# Patient Record
Sex: Female | Born: 1945
Health system: Southern US, Community
[De-identification: ages and names within clinical notes are randomized; demographics above are authoritative.]

## PROBLEM LIST (undated history)

## (undated) DIAGNOSIS — C801 Malignant (primary) neoplasm, unspecified: Secondary | ICD-10-CM

## (undated) DIAGNOSIS — N920 Excessive and frequent menstruation with regular cycle: Secondary | ICD-10-CM

## (undated) DIAGNOSIS — Z923 Personal history of irradiation: Secondary | ICD-10-CM

## (undated) DIAGNOSIS — D219 Benign neoplasm of connective and other soft tissue, unspecified: Secondary | ICD-10-CM

## (undated) HISTORY — DX: Malignant (primary) neoplasm, unspecified: C80.1

## (undated) HISTORY — DX: Excessive and frequent menstruation with regular cycle: N92.0

## (undated) HISTORY — PX: BREAST SURGERY: SHX581

## (undated) HISTORY — DX: Benign neoplasm of connective and other soft tissue, unspecified: D21.9

## (undated) HISTORY — PX: GUM SURGERY: SHX658

## (undated) HISTORY — PX: NASAL SINUS SURGERY: SHX719

---

## 1973-01-11 HISTORY — PX: BREAST EXCISIONAL BIOPSY: SUR124

## 1991-01-12 HISTORY — PX: VAGINAL HYSTERECTOMY: SUR661

## 1996-01-12 HISTORY — PX: BREAST EXCISIONAL BIOPSY: SUR124

## 1999-06-03 ENCOUNTER — Other Ambulatory Visit: Admission: RE | Admit: 1999-06-03 | Discharge: 1999-06-03 | Payer: Self-pay | Admitting: Obstetrics and Gynecology

## 1999-12-23 ENCOUNTER — Other Ambulatory Visit: Admission: RE | Admit: 1999-12-23 | Discharge: 1999-12-23 | Payer: Self-pay | Admitting: Radiology

## 1999-12-23 ENCOUNTER — Ambulatory Visit (HOSPITAL_COMMUNITY): Admission: RE | Admit: 1999-12-23 | Discharge: 1999-12-23 | Payer: Self-pay | Admitting: Internal Medicine

## 1999-12-23 ENCOUNTER — Encounter (INDEPENDENT_AMBULATORY_CARE_PROVIDER_SITE_OTHER): Payer: Self-pay | Admitting: Specialist

## 2000-01-12 DIAGNOSIS — Z923 Personal history of irradiation: Secondary | ICD-10-CM

## 2000-01-12 DIAGNOSIS — C801 Malignant (primary) neoplasm, unspecified: Secondary | ICD-10-CM

## 2000-01-12 HISTORY — DX: Malignant (primary) neoplasm, unspecified: C80.1

## 2000-01-12 HISTORY — PX: BREAST LUMPECTOMY: SHX2

## 2000-01-12 HISTORY — DX: Personal history of irradiation: Z92.3

## 2000-01-14 ENCOUNTER — Encounter (HOSPITAL_BASED_OUTPATIENT_CLINIC_OR_DEPARTMENT_OTHER): Payer: Self-pay | Admitting: General Surgery

## 2000-01-14 ENCOUNTER — Encounter (INDEPENDENT_AMBULATORY_CARE_PROVIDER_SITE_OTHER): Payer: Self-pay | Admitting: *Deleted

## 2000-01-14 ENCOUNTER — Ambulatory Visit (HOSPITAL_COMMUNITY): Admission: RE | Admit: 2000-01-14 | Discharge: 2000-01-15 | Payer: Self-pay | Admitting: General Surgery

## 2000-02-02 ENCOUNTER — Encounter: Admission: RE | Admit: 2000-02-02 | Discharge: 2000-05-02 | Payer: Self-pay | Admitting: Radiation Oncology

## 2000-06-07 ENCOUNTER — Other Ambulatory Visit: Admission: RE | Admit: 2000-06-07 | Discharge: 2000-06-07 | Payer: Self-pay | Admitting: Obstetrics and Gynecology

## 2000-06-28 ENCOUNTER — Encounter: Admission: RE | Admit: 2000-06-28 | Discharge: 2000-06-28 | Payer: Self-pay | Admitting: Oncology

## 2000-06-28 ENCOUNTER — Encounter: Payer: Self-pay | Admitting: Oncology

## 2001-07-31 ENCOUNTER — Encounter: Admission: RE | Admit: 2001-07-31 | Discharge: 2001-07-31 | Payer: Self-pay | Admitting: Oncology

## 2001-07-31 ENCOUNTER — Encounter: Payer: Self-pay | Admitting: Oncology

## 2001-10-31 ENCOUNTER — Encounter: Payer: Self-pay | Admitting: Oncology

## 2001-10-31 ENCOUNTER — Encounter: Admission: RE | Admit: 2001-10-31 | Discharge: 2001-10-31 | Payer: Self-pay | Admitting: Oncology

## 2002-01-30 ENCOUNTER — Other Ambulatory Visit: Admission: RE | Admit: 2002-01-30 | Discharge: 2002-01-30 | Payer: Self-pay | Admitting: Obstetrics and Gynecology

## 2002-08-09 ENCOUNTER — Encounter: Payer: Self-pay | Admitting: Oncology

## 2002-08-09 ENCOUNTER — Encounter: Admission: RE | Admit: 2002-08-09 | Discharge: 2002-08-09 | Payer: Self-pay | Admitting: Oncology

## 2002-09-07 ENCOUNTER — Ambulatory Visit (HOSPITAL_COMMUNITY): Admission: RE | Admit: 2002-09-07 | Discharge: 2002-09-07 | Payer: Self-pay | Admitting: Gastroenterology

## 2003-04-17 ENCOUNTER — Other Ambulatory Visit: Admission: RE | Admit: 2003-04-17 | Discharge: 2003-04-17 | Payer: Self-pay | Admitting: Obstetrics and Gynecology

## 2003-08-22 ENCOUNTER — Encounter: Admission: RE | Admit: 2003-08-22 | Discharge: 2003-08-22 | Payer: Self-pay | Admitting: Oncology

## 2003-10-17 ENCOUNTER — Encounter: Admission: RE | Admit: 2003-10-17 | Discharge: 2003-10-17 | Payer: Self-pay | Admitting: Oncology

## 2003-12-26 ENCOUNTER — Ambulatory Visit: Admission: RE | Admit: 2003-12-26 | Discharge: 2003-12-26 | Payer: Self-pay | Admitting: Radiation Oncology

## 2004-01-16 ENCOUNTER — Ambulatory Visit: Admission: RE | Admit: 2004-01-16 | Discharge: 2004-01-16 | Payer: Self-pay | Admitting: Radiation Oncology

## 2004-01-23 ENCOUNTER — Ambulatory Visit: Admission: RE | Admit: 2004-01-23 | Discharge: 2004-01-23 | Payer: Self-pay | Admitting: Radiation Oncology

## 2004-04-30 ENCOUNTER — Other Ambulatory Visit: Admission: RE | Admit: 2004-04-30 | Discharge: 2004-04-30 | Payer: Self-pay | Admitting: Addiction Medicine

## 2004-10-13 ENCOUNTER — Encounter: Admission: RE | Admit: 2004-10-13 | Discharge: 2004-10-13 | Payer: Self-pay | Admitting: Oncology

## 2004-10-19 ENCOUNTER — Ambulatory Visit: Payer: Self-pay | Admitting: Oncology

## 2005-01-11 HISTORY — PX: BREAST BIOPSY: SHX20

## 2005-06-28 ENCOUNTER — Other Ambulatory Visit: Admission: RE | Admit: 2005-06-28 | Discharge: 2005-06-28 | Payer: Self-pay | Admitting: Obstetrics and Gynecology

## 2005-10-15 ENCOUNTER — Ambulatory Visit: Payer: Self-pay | Admitting: Oncology

## 2005-10-19 ENCOUNTER — Encounter: Admission: RE | Admit: 2005-10-19 | Discharge: 2005-10-19 | Payer: Self-pay | Admitting: Oncology

## 2005-11-04 ENCOUNTER — Encounter: Admission: RE | Admit: 2005-11-04 | Discharge: 2005-11-04 | Payer: Self-pay | Admitting: Oncology

## 2006-04-15 ENCOUNTER — Ambulatory Visit: Payer: Self-pay | Admitting: Oncology

## 2006-04-20 LAB — COMPREHENSIVE METABOLIC PANEL
ALT: 14 U/L (ref 0–35)
AST: 13 U/L (ref 0–37)
Albumin: 4.3 g/dL (ref 3.5–5.2)
Calcium: 9.9 mg/dL (ref 8.4–10.5)
Chloride: 102 mEq/L (ref 96–112)
Creatinine, Ser: 0.66 mg/dL (ref 0.40–1.20)
Potassium: 3.9 mEq/L (ref 3.5–5.3)
Sodium: 140 mEq/L (ref 135–145)
Total Protein: 7.2 g/dL (ref 6.0–8.3)

## 2006-04-20 LAB — CBC WITH DIFFERENTIAL/PLATELET
BASO%: 0.9 % (ref 0.0–2.0)
EOS%: 1 % (ref 0.0–7.0)
MCH: 31.3 pg (ref 26.0–34.0)
MCHC: 34.7 g/dL (ref 32.0–36.0)
RDW: 11.5 % (ref 11.3–14.5)
WBC: 7.6 10*3/uL (ref 3.9–10.0)
lymph#: 2.6 10*3/uL (ref 0.9–3.3)

## 2006-06-21 ENCOUNTER — Ambulatory Visit: Payer: Self-pay | Admitting: Oncology

## 2006-06-30 ENCOUNTER — Other Ambulatory Visit: Admission: RE | Admit: 2006-06-30 | Discharge: 2006-06-30 | Payer: Self-pay | Admitting: Obstetrics and Gynecology

## 2006-10-21 ENCOUNTER — Encounter: Admission: RE | Admit: 2006-10-21 | Discharge: 2006-10-21 | Payer: Self-pay | Admitting: Internal Medicine

## 2007-10-02 ENCOUNTER — Encounter: Admission: RE | Admit: 2007-10-02 | Discharge: 2007-10-02 | Payer: Self-pay | Admitting: Otolaryngology

## 2007-10-26 ENCOUNTER — Ambulatory Visit (HOSPITAL_COMMUNITY): Admission: RE | Admit: 2007-10-26 | Discharge: 2007-10-26 | Payer: Self-pay | Admitting: Otolaryngology

## 2007-10-26 ENCOUNTER — Encounter (INDEPENDENT_AMBULATORY_CARE_PROVIDER_SITE_OTHER): Payer: Self-pay | Admitting: Otolaryngology

## 2007-11-07 ENCOUNTER — Encounter: Admission: RE | Admit: 2007-11-07 | Discharge: 2007-11-07 | Payer: Self-pay | Admitting: Oncology

## 2007-12-01 ENCOUNTER — Other Ambulatory Visit: Admission: RE | Admit: 2007-12-01 | Discharge: 2007-12-01 | Payer: Self-pay | Admitting: Obstetrics and Gynecology

## 2007-12-01 ENCOUNTER — Encounter: Payer: Self-pay | Admitting: Obstetrics and Gynecology

## 2007-12-01 ENCOUNTER — Ambulatory Visit: Payer: Self-pay | Admitting: Obstetrics and Gynecology

## 2007-12-06 ENCOUNTER — Emergency Department (HOSPITAL_COMMUNITY): Admission: EM | Admit: 2007-12-06 | Discharge: 2007-12-07 | Payer: Self-pay | Admitting: Emergency Medicine

## 2008-10-28 ENCOUNTER — Encounter: Admission: RE | Admit: 2008-10-28 | Discharge: 2008-10-28 | Payer: Self-pay | Admitting: Otolaryngology

## 2008-11-06 ENCOUNTER — Encounter: Admission: RE | Admit: 2008-11-06 | Discharge: 2008-11-06 | Payer: Self-pay | Admitting: Obstetrics and Gynecology

## 2008-12-18 ENCOUNTER — Ambulatory Visit: Payer: Self-pay | Admitting: Obstetrics and Gynecology

## 2008-12-18 ENCOUNTER — Other Ambulatory Visit: Admission: RE | Admit: 2008-12-18 | Discharge: 2008-12-18 | Payer: Self-pay | Admitting: Obstetrics and Gynecology

## 2009-02-18 ENCOUNTER — Ambulatory Visit: Payer: Self-pay | Admitting: Obstetrics and Gynecology

## 2009-09-17 ENCOUNTER — Other Ambulatory Visit: Admission: RE | Admit: 2009-09-17 | Discharge: 2009-09-17 | Payer: Self-pay | Admitting: Obstetrics and Gynecology

## 2009-09-17 ENCOUNTER — Ambulatory Visit: Payer: Self-pay | Admitting: Obstetrics and Gynecology

## 2009-11-21 ENCOUNTER — Encounter: Admission: RE | Admit: 2009-11-21 | Discharge: 2009-11-21 | Payer: Self-pay | Admitting: Oncology

## 2010-05-26 NOTE — H&P (Signed)
Lindsay Alvarado, GRUMBINE               ACCOUNT NO.:  [id number]   MEDICAL RECORD NO.:  [id number]          PATIENT TYPE:  AMB   LOCATION:  SDS                          FACILITY:  [facility]   PHYSICIAN:  Hermelinda Medicus, M.D.   DATE OF BIRTH:  1946-01-04   DATE OF ADMISSION:  10/26/2007  DATE OF DISCHARGE:                              HISTORY & PHYSICAL   HISTORY OF PRESENT ILLNESS:  This patient is a 65 year old female who  has had difficulty with upper dental issues and had an implant placed.  The implant placement was uneventful, but she had some follow up with  the sinusitis issues, which were difficult to resolve.  She was cultured  in the office and had been on clindamycin and has been recently on  Avelox.  Her cultures were negative and there was no evidence of any  yeast or fungal components, however, her sinus x-ray showed considerable  sinusitis and after treatment, she had a second CAT scan, which showed  right anterior ethmoid and maxillary sinuses completely opacified.  A  frontal was mentioned with the frontal sinuses incredibly small very  minimal in size.  So essentially her problem is an ethmoid and maxillary  sinus, which is totally obliterated with mucus.  On examination, she was  still showing some purulent materials and some whitish material.  She  has responded clinically very well to the Avelox, but now enters for  functional endoscopic sinus surgery.  We will get this maxillary sinus  and ethmoid sinus adequately drained doing a right ethmoidectomy and a  right maxillary sinus ostial enlargement with antrostomy.   PAST MEDICAL HISTORY:  Remarkable and the fact that she has got a  MORPHINE ALLERGY, ADHESIVE ALLERGY, VERSED, CODEINE, PENICILLIN, AND  SULFA also give her allergic response.   She takes indomethacin.  Her medication list is considerable and it is  in the chart.  She has also been on Cleocin back in June and Levaquin in  June, which the doctors felt would  resolve this problem.  She has also  been on Augmentin in the past.   PHYSICAL EXAMINATION:  VITAL SIGNS:  Well-nourished and well-developed  female with blood pressures of 141/80, her pulse is 76, and SaO2 is 95.  HEENT:  Ears are clear.  Tympanic membranes clear.  Her left nose is  perfectly normal and her right nose as you look in you see considerable  erythema and edema of the middle turbinate and some evidence of white  purulent material in that region.  There is no evidence of any posterior  nasal pharyngeal abscess or purulent materials on the back of her  throat, though the patient does complain about a bad taste.  No evidence  of any eye symptoms.  Extraocular motions are completely clear and no  evidence of any erythema or orbital cellulitis.  The dental implant  appears to be free of any symptoms.  No pain in that region.  Her larynx  is clear.  True cords, false cords, epiglottis, base of tongue were all  free of any infection or mass, but I  can see some purulent drainage in  the right piriform.  The true cord mobility, gag reflex, tongue  mobility, EOMs, and facial nerve, and shoulder strength are all  symmetrical.  CHEST:  Clear.  No rales, rhonchi, or wheezes.  CARDIOVASCULAR:  No opening snaps, murmurs, or gallops.  EXTREMITIES:  Unremarkable.   INITIAL DIAGNOSIS:  Right maxillary and ethmoid sinusitis associated  with a upper quadrant implant with a history of persistent sinusitis  refractory to the Avelox, Levaquin, Augmentin, and clindamycin.           ______________________________  Hermelinda Medicus, M.D.     JC/MEDQ  D:  10/26/2007  T:  10/26/2007  Job:  [id number]   cc:   Allayne Gitelman. Tanner, D.D.S.  Thora Lance, M.D.

## 2010-05-26 NOTE — Op Note (Signed)
NAMELISA, Lindsay Alvarado               ACCOUNT NO.:  [id number]   MEDICAL RECORD NO.:  [id number]          PATIENT TYPE:  AMB   LOCATION:  SDS                          FACILITY:  [facility]   PHYSICIAN:  Hermelinda Medicus, M.D.   DATE OF BIRTH:  11-11-1945   DATE OF PROCEDURE:  10/26/2007  DATE OF DISCHARGE:                               OPERATIVE REPORT   PREOPERATIVE DIAGNOSES:  Right maxillary and ethmoid sinusitis,  resistant to multiple antibiotics associated right upper dental implant.   POSTOPERATIVE DIAGNOSES:  Right maxillary and ethmoid sinusitis,  resistant to multiple antibiotics associated right upper dental implant.   OPERATION:  Functional endoscopic sinus surgery, right ethmoidectomy,  and right maxillary sinus ostium enlargement with antrostomy.   OPERATOR:  Hermelinda Medicus, MD   ANESTHESIA:  Local MAC.   PROCEDURE:  The patient was placed in a supine position under local MAC  anesthesia, the 1% Xylocaine with epinephrine 7 mL and topical cocaine  200 mg.  The patient was prepped and draped in a proper manner and then  the inferior turbinate was outfractured in such a way as to gain as much  space as possible in the right side of her nose.  The middle turbinate  which was also very swollen was infractured to the point of gaining and  placing at most medial port of the septum as possible to gain some space  for ethmoid and maxillary sinus drainage.  Once this was achieved, then  some anterior ethmoid material and some purulent material was removed  and this was placed for aerobic, anaerobic, and fungal culture for a  review of culture of this patient and then antibiotics were given.  Her  previous antibiotics had been Avelox.  The IV antibiotics given now are  going to be clindamycin 600 mg.  Once this was completed, then the  ethmoid using the 0-degree scope was entered completely and this was  cleared off the thickened membrane, which was at least 5 times thicker  than its  normal thickness blocking the tissues, also the area was  suctioned of the mucoid material, which was quite in nature had no odor.  Once the ethmoid was completed and completely opened, then the maxillary  sinus was approached.  Again, we approached it through very thickened  membrane.  The natural ostium was made about 5 times its normal size and  then the thickened membrane was also removed for pathology and further  mucous mucopurulent material was suctioned.  The mucopurulent material  was also cultured.  Once again aerobic and anaerobic and further  material was placed for culture as well as for pathology.  Antrostomy  was completed as we had suctioned so much material, we wanted a good  clearance of this whitish purulent matter and this was cleared very  well.  We could see into the sinus, the sinus showed very thickened  membrane, but now that it would drain adequately, we felt this would  thin down and appropriately drain.  Once this was completed, all the  sinus was suctioned and viewed.  The mucous membrane appeared to  be in  good condition throughout.  Using the scope and the side-biting forceps,  the angled Blakesley Wilder's were used to increase this size of the  natural ostium.  Once this was completed, then some Gelfoam was placed  within the ethmoid sinus and a Gelfilm was used to hold the middle  turbinate medial and then Telfa was placed within the nose.  The patient  tolerated the procedure well, is doing well postoperatively, would be  followed as an outpatient, and tomorrow will be seen in the office for  removal of the Telfa dressing, and then will be followed in 5 days or 10  days and 3 weeks or 6 weeks, 3 months or 6 months, and a year and the  patient is well aware of the risks and gains as read the booklet we  gave her.  Her vision is excellent postoperatively.  She has no  difficulties in this category.  She is aware that she is to be home and  taking it easy  and not blowing her nose hard and not doing any heavy  lifting.  The blood loss here was approximately 20 mL.           ______________________________  Hermelinda Medicus, M.D.     JC/MEDQ  D:  10/26/2007  T:  10/26/2007  Job:  [id number]   cc:   Thora Lance, M.D.  Allayne Gitelman. Tanner, D.D.S.

## 2010-05-29 NOTE — Op Note (Signed)
[place]  Patient:    Lindsay Alvarado, Lindsay Alvarado                      MRN: [id number] Proc. Date: 01/14/00 Adm. Date:  [id number] Disc. Date: [id number] Attending:  Lillia Mountain CC:         Mardene Celeste. Lurene Shadow, M.D. (2 copies)   Operative Report  PREOPERATIVE DIAGNOSIS:  Carcinoma of the right breast.  POSTOPERATIVE DIAGNOSIS:  Carcinoma of the right breast.  PROCEDURE:  Lumpectomy and sentinel lymph node biopsy.  SURGEON:  Mardene Celeste. Lurene Shadow, M.D.  ASSISTANT:  Nurse.  ANESTHESIA:  General.  HISTORY OF PRESENT ILLNESS:  [name] is a 65 year old woman presenting with an abnormal mammogram with pleomorphic calcifications.  This was biopsied by a core biopsy.  This showed an intraductal carcinoma.  She is scheduled now and brought to the operating room for wide-excision lumpectomy and sentinel lymph node biopsy.  DESCRIPTION OF PROCEDURE:  Following the induction of anesthesia with the patient positioned supinely, the right breast was infiltrated with ______ dye in the ______ region.  She had previously undergone needle localization of the region of biopsy, as well as radionuclide injection.  The breast was then prepped and draped to be included in the sterile operative field.  I made an elliptical incision encircling the localizing needle, deeming this through the skin and subcutaneous tissues, taking a generous wedge of breast tissue around the region of the localizing needle, all the way down to the chest wall.  This was removed and forwarded for specimen mammography.  Specimen mammography showed that the placement clip was well within the depths of the specimen. The specimen was then sent for touch-prep.  Touch-prep showed negative margins.  I then turned attention to the right axilla, mapping the region of the right axilla to the area of highest radioactive emissions.  I made an axillary incision, deemed this through the skin and  subcutaneous tissues, dissecting down to the region where I encountered three separate hot-blue lymph nodes, which were all dissected free and forwarded for pathologic evaluation.  Touch-preps on these lymph nodes showed no evidence of carcinoma. Final pathology is pending.  All areas of dissection were then checked thoroughly for hemostasis.  Sponge, instruments, and sharp counts were then clarified.  The wound was closed in layers as follows:  The breast wound was closed with interrupted 3-0 Vicryl sutures and the subcutaneous tissue and the skin was closed with Dermabond.  Similarly, subcutaneous tissues of the axillary wound were closed with interrupted sutures of 3-0 Vicryl and the skin closed with Dermabond.  Sterile dressing was applied, anesthetic reversed. The patient was moved from the operating room to the recovery room in stable condition, having tolerated the procedure well. DD:  01/14/00 TD:  01/14/00 Job: [id number]

## 2010-05-29 NOTE — Op Note (Signed)
   NAME:  Lindsay Alvarado, Lindsay Alvarado                         ACCOUNT NO.:  [id number]   MEDICAL RECORD NO.:  [id number]                   PATIENT TYPE:  AMB   LOCATION:  ENDO                                 FACILITY:  [facility]   PHYSICIAN:  Danise Edge, M.D.                DATE OF BIRTH:  1945/04/04   DATE OF PROCEDURE:  09/07/2002  DATE OF DISCHARGE:                                 OPERATIVE REPORT   PROCEDURE:  Screening colonoscopy.   INDICATIONS FOR PROCEDURE:  Ms. Kathi Dohn is a 65 year old female born  06/30/1945.  Ms. Halberg is scheduled to undergo her first screening  colonoscopy with polypectomy to prevent colon cancer.   ENDOSCOPIST:  Charolett Bumpers, M.D.   PREMEDICATION:  Versed 12.5 mg, Demerol 50 mg.   PROCEDURE:  After obtaining confirmed consent, Ms. Margerum was placed in  the left lateral decubitus position.  I administered intravenous Demerol and  intravenous Versed to achieve conscious sedation for the procedure.  The  patient's blood pressure, oxygen saturation, and cardiac rhythm were  monitored throughout the procedure and documented in the medical record.  Anal inspection was normal.  Digital rectal exam was normal.  The Olympus  adjustable pediatric colonoscope was introduced into the rectum and advanced  to the cecum.  Colonic preparation for the exam today was excellent.   Rectum normal.  Sigmoid colon and descending colon normal.  Splenic flexure normal.  Transverse colon normal.  Hepatic flexure normal.  Ascending colon normal.  Cecum and ileocecal valve normal.   ASSESSMENT:  Normal screening proctocolonoscopy to the cecum, no endoscopic  evidence for the presence of colorectal neoplasia.                                               Danise Edge, M.D.    MJ/MEDQ  D:  09/07/2002  T:  09/07/2002  Job:  [id number]   cc:   Thora Lance, M.D.  [address] [place]  [place] [address]  Fax: [phone]   Tasia Catchings, M.D.  [address]  [place]  [place] [address]  Fax: [phone]   Valentino Hue. Magrinat, M.D.  [address] [facility]  [place]  [place] [address]  Fax: [phone]

## 2010-10-12 LAB — CBC
HCT: 39.8
Hemoglobin: 13.4
MCHC: 33.6
MCV: 92.2
Platelets: 280
RBC: 4.31
RDW: 12.9
WBC: 7.8

## 2010-10-12 LAB — FUNGUS CULTURE W SMEAR: Fungal Smear: NONE SEEN

## 2010-10-12 LAB — URINALYSIS, ROUTINE W REFLEX MICROSCOPIC
Bilirubin Urine: NEGATIVE
Glucose, UA: NEGATIVE
Hgb urine dipstick: NEGATIVE
Ketones, ur: NEGATIVE
Nitrite: NEGATIVE
Protein, ur: NEGATIVE
Specific Gravity, Urine: 1.021
Urobilinogen, UA: 0.2
pH: 6

## 2010-10-12 LAB — ANAEROBIC CULTURE

## 2010-10-12 LAB — CULTURE, ROUTINE-SINUS: Culture: NO GROWTH

## 2010-10-23 ENCOUNTER — Other Ambulatory Visit: Payer: Self-pay | Admitting: Obstetrics and Gynecology

## 2010-10-23 DIAGNOSIS — Z1231 Encounter for screening mammogram for malignant neoplasm of breast: Secondary | ICD-10-CM

## 2010-11-19 ENCOUNTER — Encounter: Payer: Self-pay | Admitting: Obstetrics and Gynecology

## 2010-11-23 ENCOUNTER — Ambulatory Visit
Admission: RE | Admit: 2010-11-23 | Discharge: 2010-11-23 | Disposition: A | Discharge status: Home / Self Care | Payer: BC Managed Care – PPO | Source: Ambulatory Visit | Attending: Obstetrics and Gynecology | Admitting: Obstetrics and Gynecology

## 2010-11-23 DIAGNOSIS — Z1231 Encounter for screening mammogram for malignant neoplasm of breast: Secondary | ICD-10-CM

## 2010-11-30 ENCOUNTER — Encounter: Payer: Self-pay | Admitting: Gynecology

## 2010-11-30 DIAGNOSIS — C801 Malignant (primary) neoplasm, unspecified: Secondary | ICD-10-CM | POA: Insufficient documentation

## 2010-11-30 DIAGNOSIS — N920 Excessive and frequent menstruation with regular cycle: Secondary | ICD-10-CM | POA: Insufficient documentation

## 2010-11-30 DIAGNOSIS — D219 Benign neoplasm of connective and other soft tissue, unspecified: Secondary | ICD-10-CM | POA: Insufficient documentation

## 2010-12-01 ENCOUNTER — Other Ambulatory Visit (HOSPITAL_COMMUNITY)
Admission: RE | Admit: 2010-12-01 | Discharge: 2010-12-01 | Disposition: A | Discharge status: Home / Self Care | Payer: BC Managed Care – PPO | Source: Ambulatory Visit | Attending: Obstetrics and Gynecology | Admitting: Obstetrics and Gynecology

## 2010-12-01 ENCOUNTER — Encounter: Payer: Self-pay | Admitting: Obstetrics and Gynecology

## 2010-12-01 ENCOUNTER — Ambulatory Visit (INDEPENDENT_AMBULATORY_CARE_PROVIDER_SITE_OTHER): Payer: BC Managed Care – PPO | Admitting: Obstetrics and Gynecology

## 2010-12-01 VITALS — BP 130/80 | Ht 64.5 in | Wt 231.0 lb

## 2010-12-01 DIAGNOSIS — Z01419 Encounter for gynecological examination (general) (routine) without abnormal findings: Secondary | ICD-10-CM

## 2010-12-01 DIAGNOSIS — E559 Vitamin D deficiency, unspecified: Secondary | ICD-10-CM

## 2010-12-01 DIAGNOSIS — N644 Mastodynia: Secondary | ICD-10-CM

## 2010-12-01 DIAGNOSIS — E7800 Pure hypercholesterolemia, unspecified: Secondary | ICD-10-CM

## 2010-12-01 NOTE — Progress Notes (Signed)
The patient came to see me today for her annual GYN exam. She is up-to-date both mammograms and bone densities. She is doing well without HRT which is not an option for her because of her breast cancer. She probing her lab work which showed an elevated triglyceride, elevated LDL, and borderline low vitamin D. She is having no pelvic pain. She is having no vaginal bleeding.  HEENT: Within normal limits. Neck: No masses.  Kennon Portela present. Supraclavicular lymph nodes: Not enlarged. Breasts: Examined in both sitting and lying position. Symmetrical without skin changes or masses. Abdomen: Soft no masses guarding or rebound. No hernias. Pelvic: External within normal limits. BUS within normal limits. Vaginal examination shows good estrogen effect, no cystocele enterocele or rectocele. Cervix and uterus absent. Adnexa within normal limits. Rectovaginal confirmatory. Extremities within normal limits.  Assessment: #1. Breast cancer #2. Elevated lipids #3. Borderline vitamin D level  Plan: Continue yearly mammograms. Colonoscopy at [facility]. Increase vitamin D from 2000 to 3000 IU daily. Low cholesterol diet and increase exercise. Recheck lipid profile and vitamin D level in 6 months.

## 2011-01-11 ENCOUNTER — Encounter: Payer: BC Managed Care – PPO | Admitting: Obstetrics and Gynecology

## 2011-10-22 ENCOUNTER — Other Ambulatory Visit: Payer: Self-pay | Admitting: Obstetrics and Gynecology

## 2011-10-25 ENCOUNTER — Telehealth: Payer: Self-pay | Admitting: *Deleted

## 2011-10-25 NOTE — Telephone Encounter (Signed)
Pt called c/o left breast tenderness over weekend, called pt back today and no longer having breast tenderness. Pt said she will watch for now and call back to make OV if tenderness occurs again.

## 2011-12-03 ENCOUNTER — Ambulatory Visit (INDEPENDENT_AMBULATORY_CARE_PROVIDER_SITE_OTHER): Payer: BC Managed Care – PPO | Admitting: Obstetrics and Gynecology

## 2011-12-03 ENCOUNTER — Encounter: Payer: Self-pay | Admitting: Obstetrics and Gynecology

## 2011-12-03 VITALS — BP 128/78 | Ht 65.0 in | Wt 236.0 lb

## 2011-12-03 DIAGNOSIS — Z01419 Encounter for gynecological examination (general) (routine) without abnormal findings: Secondary | ICD-10-CM

## 2011-12-03 NOTE — Progress Notes (Signed)
Patient came to see me today for her annual GYN exam. Over the past month she has had persistent breast tenderness in her left breast under the  nipple. It is more diffuse than  focal. She has had ductal carcinoma in situ of the right breast. She was treated with lumpectomy and radiation. She has not required at adjunctive therapy. She had a vaginal hysterectomy in 1993 for fibroids and menorrhagia. She is having no vaginal bleeding. She is having no pelvic pain. She is doing well in terms of menopausal symptoms. She had normal bone density in 2011. She has never had  abnormal Pap smears. Her last Pap smear was 2012.She does her lab through PCP.  HEENT: Within normal limits. Neck: No masses. Supraclavicular lymph nodes: Not enlarged. Breasts: Examined in both sitting and lying position. Right breast has divot above the nipple from lumpectomy. No masses. Left breast has no skin changes and no masses. Abdomen: Soft no masses guarding or rebound. No hernias. Pelvic: External within normal limits. BUS within normal limits. Vaginal examination shows good estrogen effect, no cystocele enterocele or rectocele. Cervix and uterus absent. Adnexa within normal limits. Rectovaginal confirmatory. Extremities within normal limits.  Assessment: Ductalcarcinoma in situ of right breast. Mastodynia of left breast.  Assessment: Mammogram. Pap not done.The new Pap smear guidelines were discussed with the patient.

## 2011-12-03 NOTE — Patient Instructions (Signed)
We will call you with mammogram appointment.

## 2011-12-04 LAB — URINALYSIS W MICROSCOPIC + REFLEX CULTURE
Bacteria, UA: NONE SEEN
Bilirubin Urine: NEGATIVE
Casts: NONE SEEN
Crystals: NONE SEEN
Glucose, UA: NEGATIVE mg/dL
Hgb urine dipstick: NEGATIVE
Ketones, ur: NEGATIVE mg/dL
Specific Gravity, Urine: 1.019 (ref 1.005–1.030)
pH: 6.5 (ref 5.0–8.0)

## 2011-12-06 ENCOUNTER — Encounter: Payer: Self-pay | Admitting: Obstetrics and Gynecology

## 2011-12-06 ENCOUNTER — Telehealth: Payer: Self-pay | Admitting: *Deleted

## 2011-12-06 DIAGNOSIS — N644 Mastodynia: Secondary | ICD-10-CM

## 2011-12-06 NOTE — Telephone Encounter (Signed)
Order placed in epic

## 2011-12-06 NOTE — Telephone Encounter (Signed)
Message copied by Aura Camps on Mon Dec 06, 2011  8:50 AM ------      Message from: Lindsay Alvarado      Created: Fri Dec 03, 2011  3:07 PM       Patient is having tenderness in left breast under nipple. DCIS of right breast. Schedule diagnostic mammography at [facility] after dec 13 early in am or late in day

## 2011-12-08 NOTE — Telephone Encounter (Signed)
APPT. 12/23/11 @ 8:30 AM

## 2011-12-15 ENCOUNTER — Encounter: Payer: Self-pay | Admitting: Obstetrics and Gynecology

## 2012-01-03 ENCOUNTER — Ambulatory Visit
Admission: RE | Admit: 2012-01-03 | Discharge: 2012-01-03 | Disposition: A | Discharge status: Home / Self Care | Payer: BC Managed Care – PPO | Source: Ambulatory Visit | Attending: Obstetrics and Gynecology | Admitting: Obstetrics and Gynecology

## 2012-01-03 ENCOUNTER — Other Ambulatory Visit: Payer: Self-pay | Admitting: Obstetrics and Gynecology

## 2012-01-03 DIAGNOSIS — N644 Mastodynia: Secondary | ICD-10-CM

## 2012-11-30 ENCOUNTER — Other Ambulatory Visit: Payer: Self-pay

## 2012-11-30 DIAGNOSIS — Z1231 Encounter for screening mammogram for malignant neoplasm of breast: Secondary | ICD-10-CM

## 2013-01-08 IMAGING — MG MM DIGITAL SCREENING BILAT W/ CAD
4 series · 4 of 4 positions shown · non-contrast
Comparison: none

DG SCREEN MAMMOGRAM BILATERAL
Bilateral CC and MLO view(s) were taken.
Technologist: Paulus N Ceejay

RIGHT DIGITAL SCREENING MAMMOGRAM WITH CAD:
The breast tissue is heterogeneously dense.  Right postsurgical change.  No masses or malignant 
type calcifications are identified.  Compared with prior studies.
Images were processed with CAD.

[R CC]
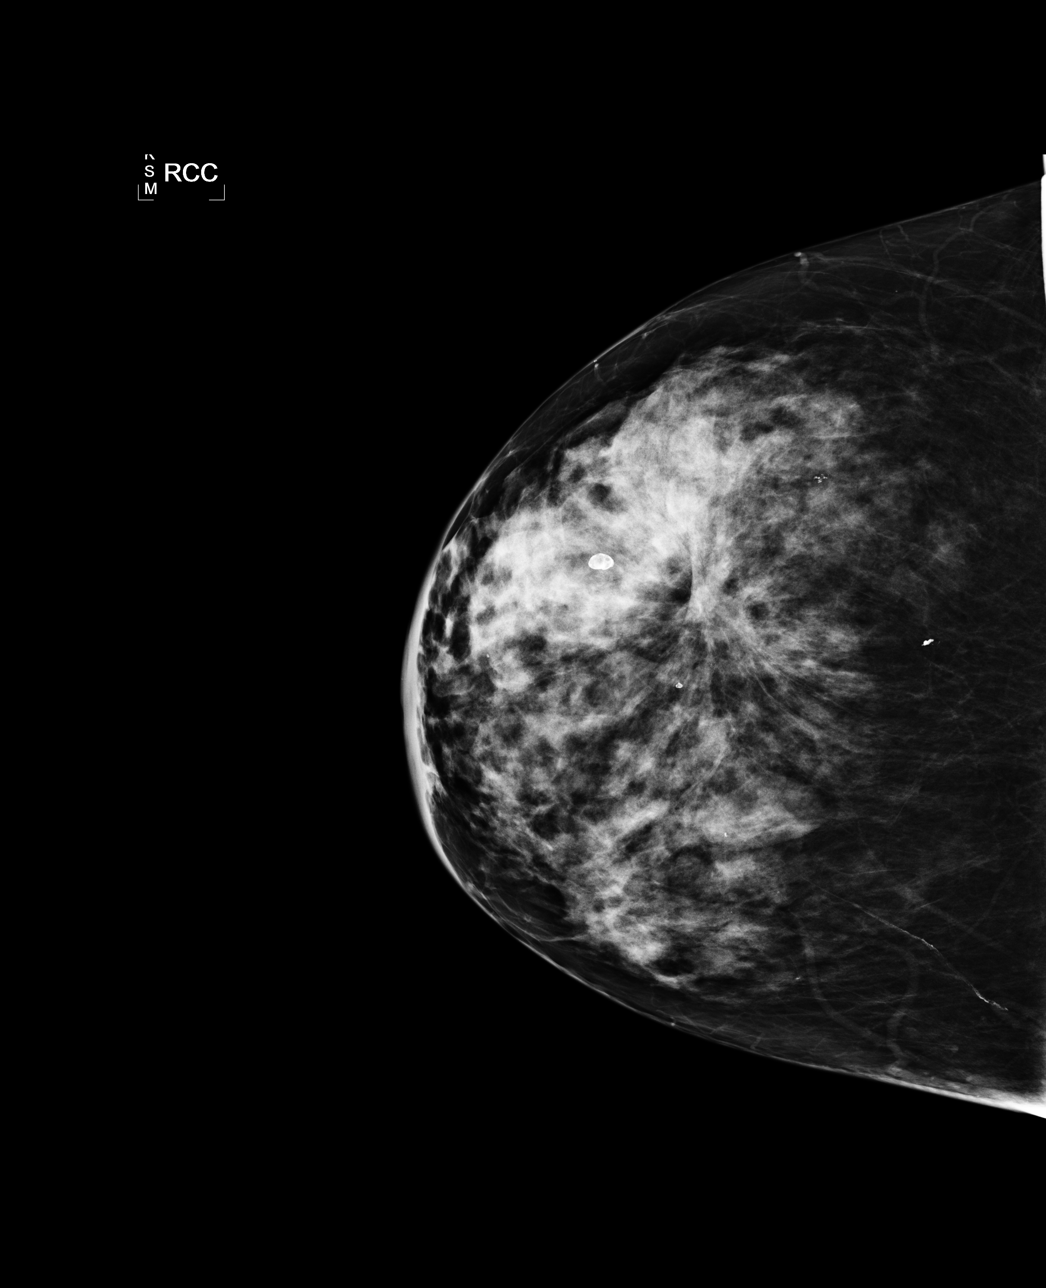

[L CC]
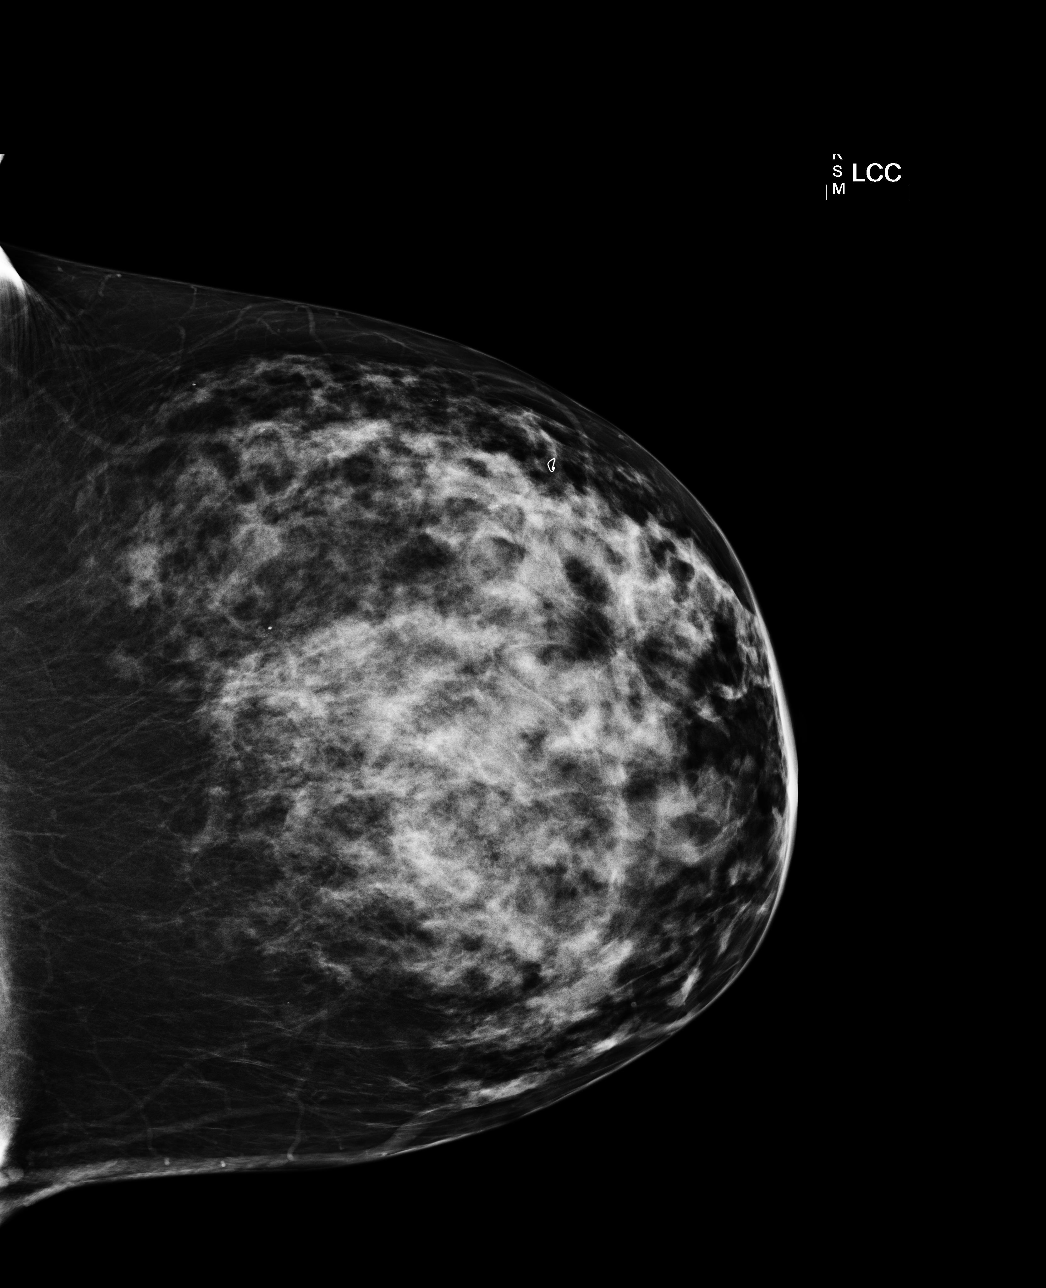

[L MLO]
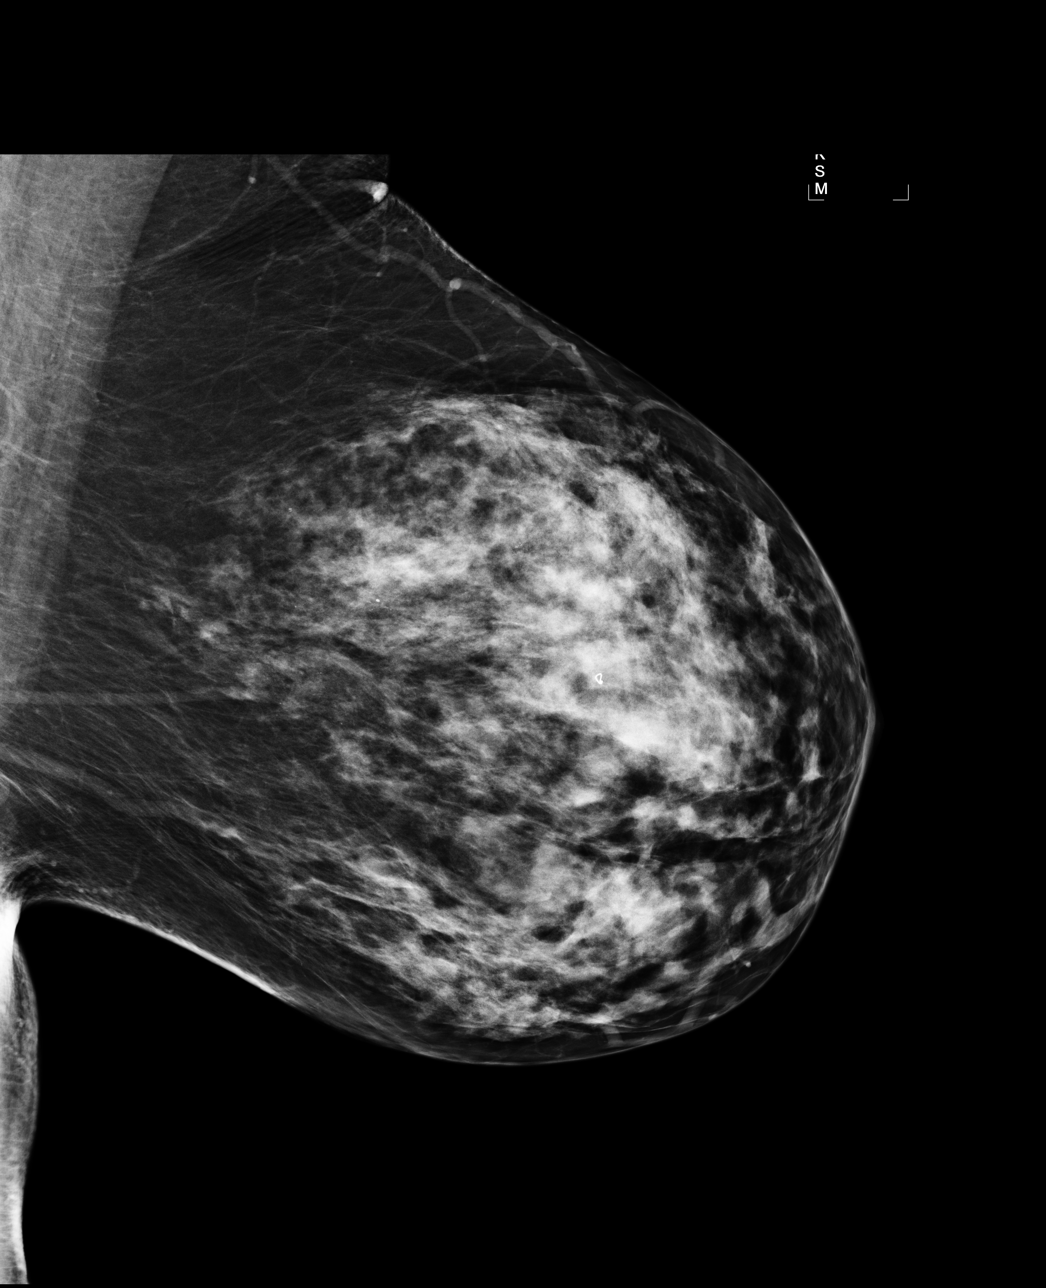

[R MLO]
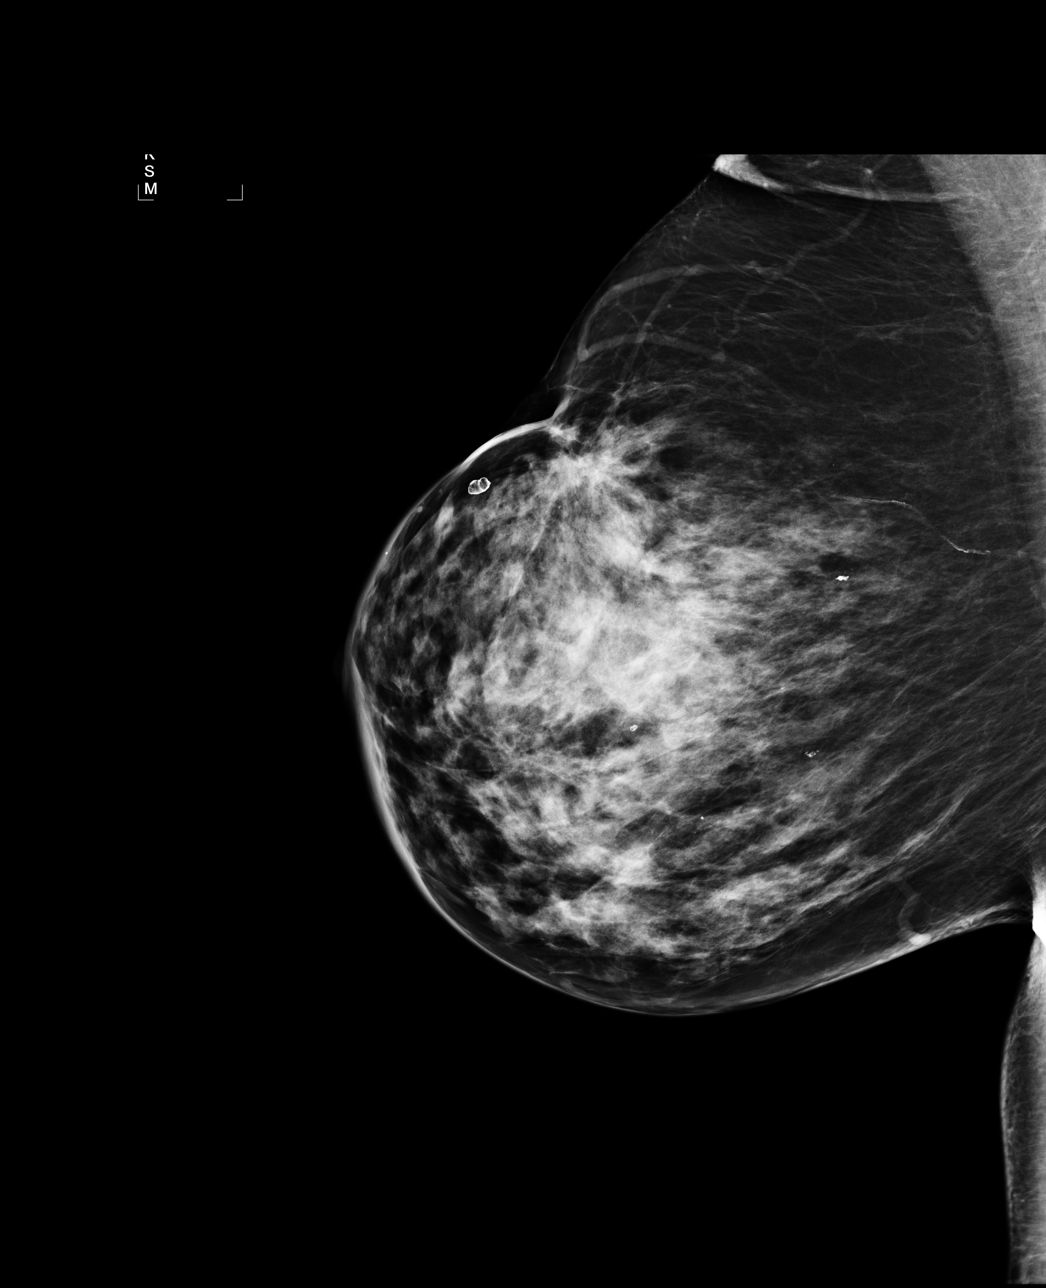

[4 of 4 positions shown; findings below may reference images not displayed]

IMPRESSION: No specific mammographic evidence of malignancy.  Next screening mammogram is recommended in one 
year.

A result letter of this screening mammogram will be mailed directly to the patient.

ASSESSMENT: Negative - BI-RADS 1

Screening mammogram in 1 year.
,

## 2013-01-16 ENCOUNTER — Ambulatory Visit
Admission: RE | Admit: 2013-01-16 | Discharge: 2013-01-16 | Disposition: A | Discharge status: Home / Self Care | Payer: BC Managed Care – PPO | Source: Ambulatory Visit

## 2013-01-16 DIAGNOSIS — Z1231 Encounter for screening mammogram for malignant neoplasm of breast: Secondary | ICD-10-CM

## 2013-11-12 ENCOUNTER — Encounter: Payer: Self-pay | Admitting: Obstetrics and Gynecology

## 2014-01-17 ENCOUNTER — Other Ambulatory Visit: Payer: Self-pay

## 2014-01-17 DIAGNOSIS — Z1231 Encounter for screening mammogram for malignant neoplasm of breast: Secondary | ICD-10-CM

## 2014-01-21 ENCOUNTER — Other Ambulatory Visit: Payer: Self-pay | Admitting: Internal Medicine

## 2014-01-21 DIAGNOSIS — N644 Mastodynia: Secondary | ICD-10-CM

## 2014-01-30 ENCOUNTER — Other Ambulatory Visit: Payer: Self-pay

## 2014-01-30 ENCOUNTER — Ambulatory Visit
Admission: RE | Admit: 2014-01-30 | Discharge: 2014-01-30 | Disposition: A | Discharge status: Home / Self Care | Payer: BLUE CROSS/BLUE SHIELD | Source: Ambulatory Visit | Attending: Internal Medicine | Admitting: Internal Medicine

## 2014-01-30 DIAGNOSIS — N644 Mastodynia: Secondary | ICD-10-CM

## 2015-01-01 ENCOUNTER — Other Ambulatory Visit: Payer: Self-pay

## 2015-01-01 DIAGNOSIS — Z1231 Encounter for screening mammogram for malignant neoplasm of breast: Secondary | ICD-10-CM

## 2015-02-04 ENCOUNTER — Ambulatory Visit
Admission: RE | Admit: 2015-02-04 | Discharge: 2015-02-04 | Disposition: A | Discharge status: Home / Self Care | Payer: BLUE CROSS/BLUE SHIELD | Source: Ambulatory Visit

## 2015-02-04 DIAGNOSIS — Z1231 Encounter for screening mammogram for malignant neoplasm of breast: Secondary | ICD-10-CM

## 2015-06-03 DIAGNOSIS — M7989 Other specified soft tissue disorders: Secondary | ICD-10-CM | POA: Diagnosis not present

## 2015-06-03 DIAGNOSIS — R030 Elevated blood-pressure reading, without diagnosis of hypertension: Secondary | ICD-10-CM | POA: Diagnosis not present

## 2015-07-28 DIAGNOSIS — L820 Inflamed seborrheic keratosis: Secondary | ICD-10-CM | POA: Diagnosis not present

## 2015-07-28 DIAGNOSIS — L918 Other hypertrophic disorders of the skin: Secondary | ICD-10-CM | POA: Diagnosis not present

## 2015-09-25 DIAGNOSIS — H2513 Age-related nuclear cataract, bilateral: Secondary | ICD-10-CM | POA: Diagnosis not present

## 2015-09-25 DIAGNOSIS — H40053 Ocular hypertension, bilateral: Secondary | ICD-10-CM | POA: Diagnosis not present

## 2015-09-25 DIAGNOSIS — H35372 Puckering of macula, left eye: Secondary | ICD-10-CM | POA: Diagnosis not present

## 2015-10-21 DIAGNOSIS — Z0000 Encounter for general adult medical examination without abnormal findings: Secondary | ICD-10-CM | POA: Diagnosis not present

## 2015-11-26 DIAGNOSIS — L700 Acne vulgaris: Secondary | ICD-10-CM | POA: Diagnosis not present

## 2015-11-26 DIAGNOSIS — D225 Melanocytic nevi of trunk: Secondary | ICD-10-CM | POA: Diagnosis not present

## 2015-11-26 DIAGNOSIS — L821 Other seborrheic keratosis: Secondary | ICD-10-CM | POA: Diagnosis not present

## 2015-11-26 DIAGNOSIS — L820 Inflamed seborrheic keratosis: Secondary | ICD-10-CM | POA: Diagnosis not present

## 2015-11-26 DIAGNOSIS — L649 Androgenic alopecia, unspecified: Secondary | ICD-10-CM | POA: Diagnosis not present

## 2015-12-10 DIAGNOSIS — M8588 Other specified disorders of bone density and structure, other site: Secondary | ICD-10-CM | POA: Diagnosis not present

## 2015-12-30 ENCOUNTER — Other Ambulatory Visit: Payer: Self-pay | Admitting: Internal Medicine

## 2015-12-30 DIAGNOSIS — Z1231 Encounter for screening mammogram for malignant neoplasm of breast: Secondary | ICD-10-CM

## 2016-01-15 DIAGNOSIS — H2513 Age-related nuclear cataract, bilateral: Secondary | ICD-10-CM | POA: Diagnosis not present

## 2016-01-15 DIAGNOSIS — H04123 Dry eye syndrome of bilateral lacrimal glands: Secondary | ICD-10-CM | POA: Diagnosis not present

## 2016-02-02 DIAGNOSIS — H2511 Age-related nuclear cataract, right eye: Secondary | ICD-10-CM | POA: Diagnosis not present

## 2016-02-04 DIAGNOSIS — H2512 Age-related nuclear cataract, left eye: Secondary | ICD-10-CM | POA: Diagnosis not present

## 2016-02-09 ENCOUNTER — Ambulatory Visit: Payer: BLUE CROSS/BLUE SHIELD

## 2016-02-09 DIAGNOSIS — H2512 Age-related nuclear cataract, left eye: Secondary | ICD-10-CM | POA: Diagnosis not present

## 2016-03-01 ENCOUNTER — Ambulatory Visit: Payer: BLUE CROSS/BLUE SHIELD

## 2016-03-24 ENCOUNTER — Ambulatory Visit
Admission: RE | Admit: 2016-03-24 | Discharge: 2016-03-24 | Disposition: A | Discharge status: Home / Self Care | Payer: BLUE CROSS/BLUE SHIELD | Source: Ambulatory Visit | Attending: Internal Medicine | Admitting: Internal Medicine

## 2016-03-24 DIAGNOSIS — Z1231 Encounter for screening mammogram for malignant neoplasm of breast: Secondary | ICD-10-CM

## 2016-03-24 HISTORY — DX: Personal history of irradiation: Z92.3

## 2016-06-28 DIAGNOSIS — J320 Chronic maxillary sinusitis: Secondary | ICD-10-CM | POA: Diagnosis not present

## 2016-06-28 DIAGNOSIS — J040 Acute laryngitis: Secondary | ICD-10-CM | POA: Diagnosis not present

## 2016-06-28 DIAGNOSIS — H6121 Impacted cerumen, right ear: Secondary | ICD-10-CM | POA: Diagnosis not present

## 2016-06-28 DIAGNOSIS — J322 Chronic ethmoidal sinusitis: Secondary | ICD-10-CM | POA: Diagnosis not present

## 2016-07-02 DIAGNOSIS — M1711 Unilateral primary osteoarthritis, right knee: Secondary | ICD-10-CM | POA: Diagnosis not present

## 2016-07-02 DIAGNOSIS — I872 Venous insufficiency (chronic) (peripheral): Secondary | ICD-10-CM | POA: Diagnosis not present

## 2016-10-21 DIAGNOSIS — L299 Pruritus, unspecified: Secondary | ICD-10-CM | POA: Diagnosis not present

## 2016-10-21 DIAGNOSIS — I872 Venous insufficiency (chronic) (peripheral): Secondary | ICD-10-CM | POA: Diagnosis not present

## 2016-10-21 DIAGNOSIS — Z23 Encounter for immunization: Secondary | ICD-10-CM | POA: Diagnosis not present

## 2016-10-21 DIAGNOSIS — Z0000 Encounter for general adult medical examination without abnormal findings: Secondary | ICD-10-CM | POA: Diagnosis not present

## 2016-10-21 DIAGNOSIS — M1711 Unilateral primary osteoarthritis, right knee: Secondary | ICD-10-CM | POA: Diagnosis not present

## 2016-10-21 DIAGNOSIS — R7301 Impaired fasting glucose: Secondary | ICD-10-CM | POA: Diagnosis not present

## 2016-10-21 DIAGNOSIS — E7800 Pure hypercholesterolemia, unspecified: Secondary | ICD-10-CM | POA: Diagnosis not present

## 2016-12-09 DIAGNOSIS — M25561 Pain in right knee: Secondary | ICD-10-CM | POA: Diagnosis not present

## 2016-12-09 DIAGNOSIS — M545 Low back pain: Secondary | ICD-10-CM | POA: Diagnosis not present

## 2017-02-04 DIAGNOSIS — L700 Acne vulgaris: Secondary | ICD-10-CM | POA: Diagnosis not present

## 2017-02-04 DIAGNOSIS — L304 Erythema intertrigo: Secondary | ICD-10-CM | POA: Diagnosis not present

## 2017-02-04 DIAGNOSIS — L821 Other seborrheic keratosis: Secondary | ICD-10-CM | POA: Diagnosis not present

## 2017-02-04 DIAGNOSIS — D1801 Hemangioma of skin and subcutaneous tissue: Secondary | ICD-10-CM | POA: Diagnosis not present

## 2017-02-22 ENCOUNTER — Other Ambulatory Visit: Payer: Self-pay | Admitting: Internal Medicine

## 2017-02-22 DIAGNOSIS — N6459 Other signs and symptoms in breast: Secondary | ICD-10-CM | POA: Diagnosis not present

## 2017-02-22 DIAGNOSIS — R1030 Lower abdominal pain, unspecified: Secondary | ICD-10-CM

## 2017-02-22 DIAGNOSIS — M549 Dorsalgia, unspecified: Secondary | ICD-10-CM | POA: Diagnosis not present

## 2017-02-23 ENCOUNTER — Ambulatory Visit
Admission: RE | Admit: 2017-02-23 | Discharge: 2017-02-23 | Disposition: A | Discharge status: Home / Self Care | Payer: BLUE CROSS/BLUE SHIELD | Source: Ambulatory Visit | Attending: Internal Medicine | Admitting: Internal Medicine

## 2017-02-23 DIAGNOSIS — N6459 Other signs and symptoms in breast: Secondary | ICD-10-CM

## 2017-02-23 DIAGNOSIS — N6489 Other specified disorders of breast: Secondary | ICD-10-CM | POA: Diagnosis not present

## 2017-02-23 DIAGNOSIS — R922 Inconclusive mammogram: Secondary | ICD-10-CM | POA: Diagnosis not present

## 2017-03-08 ENCOUNTER — Ambulatory Visit
Admission: RE | Admit: 2017-03-08 | Discharge: 2017-03-08 | Disposition: A | Discharge status: Home / Self Care | Payer: BLUE CROSS/BLUE SHIELD | Source: Ambulatory Visit | Attending: Internal Medicine | Admitting: Internal Medicine

## 2017-03-08 DIAGNOSIS — K760 Fatty (change of) liver, not elsewhere classified: Secondary | ICD-10-CM | POA: Diagnosis not present

## 2017-03-08 DIAGNOSIS — R1030 Lower abdominal pain, unspecified: Secondary | ICD-10-CM

## 2017-03-08 MED ORDER — IOPAMIDOL (ISOVUE-300) INJECTION 61%
125.0000 mL | Freq: Once | INTRAVENOUS | Status: AC | PRN
  Administered 2017-03-08: 100 mL via INTRAVENOUS

## 2017-03-22 DIAGNOSIS — J322 Chronic ethmoidal sinusitis: Secondary | ICD-10-CM | POA: Diagnosis not present

## 2017-03-22 DIAGNOSIS — H6121 Impacted cerumen, right ear: Secondary | ICD-10-CM | POA: Diagnosis not present

## 2017-03-22 DIAGNOSIS — J320 Chronic maxillary sinusitis: Secondary | ICD-10-CM | POA: Diagnosis not present

## 2017-03-31 DIAGNOSIS — K639 Disease of intestine, unspecified: Secondary | ICD-10-CM | POA: Diagnosis not present

## 2017-04-04 DIAGNOSIS — K5730 Diverticulosis of large intestine without perforation or abscess without bleeding: Secondary | ICD-10-CM | POA: Diagnosis not present

## 2017-04-04 DIAGNOSIS — K635 Polyp of colon: Secondary | ICD-10-CM | POA: Diagnosis not present

## 2017-04-04 DIAGNOSIS — R933 Abnormal findings on diagnostic imaging of other parts of digestive tract: Secondary | ICD-10-CM | POA: Diagnosis not present

## 2017-04-06 DIAGNOSIS — K635 Polyp of colon: Secondary | ICD-10-CM | POA: Diagnosis not present

## 2017-04-08 DIAGNOSIS — J0100 Acute maxillary sinusitis, unspecified: Secondary | ICD-10-CM | POA: Diagnosis not present

## 2017-04-18 DIAGNOSIS — Z961 Presence of intraocular lens: Secondary | ICD-10-CM | POA: Diagnosis not present

## 2017-04-18 DIAGNOSIS — H43393 Other vitreous opacities, bilateral: Secondary | ICD-10-CM | POA: Diagnosis not present

## 2017-04-18 DIAGNOSIS — H26491 Other secondary cataract, right eye: Secondary | ICD-10-CM | POA: Diagnosis not present

## 2017-04-18 DIAGNOSIS — H43813 Vitreous degeneration, bilateral: Secondary | ICD-10-CM | POA: Diagnosis not present

## 2017-05-20 DIAGNOSIS — M7022 Olecranon bursitis, left elbow: Secondary | ICD-10-CM | POA: Diagnosis not present

## 2017-08-22 ENCOUNTER — Ambulatory Visit (INDEPENDENT_AMBULATORY_CARE_PROVIDER_SITE_OTHER): Payer: BLUE CROSS/BLUE SHIELD

## 2017-08-22 ENCOUNTER — Encounter: Payer: Self-pay | Admitting: Podiatry

## 2017-08-22 ENCOUNTER — Ambulatory Visit: Payer: BLUE CROSS/BLUE SHIELD | Admitting: Podiatry

## 2017-08-22 DIAGNOSIS — R202 Paresthesia of skin: Secondary | ICD-10-CM | POA: Diagnosis not present

## 2017-08-22 DIAGNOSIS — R200 Anesthesia of skin: Secondary | ICD-10-CM

## 2017-08-22 DIAGNOSIS — M722 Plantar fascial fibromatosis: Secondary | ICD-10-CM | POA: Diagnosis not present

## 2017-08-22 DIAGNOSIS — M79673 Pain in unspecified foot: Secondary | ICD-10-CM

## 2017-08-22 MED ORDER — MELOXICAM 15 MG PO TABS: 15.0000 mg | ORAL_TABLET | Freq: Every day | ORAL | 0 refills | Status: AC

## 2017-08-22 NOTE — Patient Instructions (Signed)

## 2017-08-22 NOTE — Progress Notes (Signed)
Subjective:    Patient ID: Lindsay Alvarado, female    DOB: Dec 13, 1945, 72 y.o.   MRN: [id number]  HPI 72-year-old female presents the office today for a couple of concerns.  She states she is getting some numbness into her left big toe, fourth toe into the heel.  She gets more symptoms in the right side into the toes.  This is been a chronic issue for her for over 1 year and has been about the same except for start to go into the heel on the left side.  She also states that she did get some arch pain intermittently to her feet.  No recent injury or trauma.  She also has concern for possible warts to her feet.  No recent treatment.  No swelling redness or drainage that she has noticed.  No other concerns.  No open sores.   Review of Systems  All other systems reviewed and are negative.  Past Medical History:  Diagnosis Date  . [facility]) 2002   Breast cancer-DCIS-Right  . Fibroid   . Menorrhagia   . Personal history of radiation therapy 2002    Past Surgical History:  Procedure Laterality Date  . BREAST BIOPSY Left 2007  . BREAST EXCISIONAL BIOPSY Right 1998  . BREAST EXCISIONAL BIOPSY Right 1975  . BREAST LUMPECTOMY Right 2002  . BREAST SURGERY     Lumpectomy-Right  . GUM SURGERY    . NASAL SINUS SURGERY    . VAGINAL HYSTERECTOMY  1993     Current Outpatient Medications:  .  Calcium Carbonate-Vitamin D (CALCIUM + D PO), Take by mouth.  , Disp: , Rfl:  .  Cholecalciferol (VITAMIN D PO), Take 3,000 Units by mouth.  , Disp: , Rfl:  .  MAGNESIUM PO, Take by mouth.  , Disp: , Rfl:  .  meloxicam (MOBIC) 15 MG tablet, Take 1 tablet (15 mg total) by mouth daily., Disp: 30 tablet, Rfl: 0 .  Multiple Vitamin (MULTIVITAMIN) tablet, Take 1 tablet by mouth daily.  , Disp: , Rfl:   Allergies  Allergen Reactions  . Codeine   . Morphine And Related   . Penicillins   . Sulfa Antibiotics          Objective:   Physical Exam  General: AAO x3, NAD  Dermatological:  Hyperkeratotic lesion present right submetatarsal area as well as medial hallux bilaterally.  No underlying ulceration drainage or any signs of infection.  Areas of concern for warts are actually hyperkeratotic lesions.  There is no open lesions or pre-ulcerative lesions.  Vascular: Dorsalis Pedis artery and Posterior Tibial artery pedal pulses are 2/4 bilateral with immedate capillary fill time. Pedal hair growth present. No varicosities and no lower extremity edema present bilateral. There is no pain with calf compression, swelling, warmth, erythema.   Neruologic: Minimal decreased with Derrel Nip monofilament to the digits.  Negative Tinel sign.  Musculoskeletal: There is a decrease in medial arch upon weightbearing.  Subjectively there is tenderness along the medial band plantar fascia the arch of the foot as well as the medial foot there is no significant tenderness today.  Mild discomfort on the plantar medial tubercle of the calcaneus and insertion of plantar fascia.  There is no pain on lateral compression of the calcaneus.  No edema, erythema.  No other areas of tenderness.  Muscular strength 5/5 in all groups tested bilateral.  Hammertoe deformities present.  Gait: Unassisted, Nonantalgic.     Assessment & Plan:  72 year old  female with bilateral foot pain, plantar fasciitis with hyperkeratotic lesions; neuritis -Treatment options discussed including all alternatives, risks, and complications -Etiology of symptoms were discussed -X-rays were obtained and reviewed with the patient.  No evidence of acute fracture or stress fracture identified today. -I sharply debrided the hyperkeratotic lesions to the courtesy without any complications or bleeding.  Discussed with her moisturizer to her feet daily. -Ultimately I do think she will benefit more from a custom orthotic to help support her arch.  We discussed the change in shoes as well.  Check with orthotic coverage and will have her  follow-up with Liliane Channel for this.  We discussed stretching, icing exercises daily as well. -Discussed with her the neuritis could be coming from her foot pain and inflammation there were no want to this.  If she gets resolution of the arch, heel pain but continues to have the numbness to her toes will get a nerve conduction test.  Trula Slade DPM

## 2017-08-31 ENCOUNTER — Telehealth: Payer: Self-pay | Admitting: Podiatry

## 2017-08-31 NOTE — Telephone Encounter (Signed)
Left message for pt that orthotics covered at 100% after $40.00 copay and to call if she wants to proceed and I will get her scheduled to see Liliane Channel.

## 2017-09-08 DIAGNOSIS — Z961 Presence of intraocular lens: Secondary | ICD-10-CM | POA: Diagnosis not present

## 2017-09-08 DIAGNOSIS — H26491 Other secondary cataract, right eye: Secondary | ICD-10-CM | POA: Diagnosis not present

## 2017-09-20 ENCOUNTER — Ambulatory Visit (INDEPENDENT_AMBULATORY_CARE_PROVIDER_SITE_OTHER): Payer: BLUE CROSS/BLUE SHIELD | Admitting: Podiatry

## 2017-09-20 ENCOUNTER — Ambulatory Visit: Payer: BLUE CROSS/BLUE SHIELD | Admitting: Orthotics

## 2017-09-20 DIAGNOSIS — M722 Plantar fascial fibromatosis: Secondary | ICD-10-CM

## 2017-09-20 DIAGNOSIS — M79673 Pain in unspecified foot: Secondary | ICD-10-CM

## 2017-09-21 NOTE — Progress Notes (Signed)
Lindsay Alvarado presents the office today requesting orthotics and she wants to be measured for the inserts today.  She discussed with a dress orthotic.  I have Lindsay Alvarado evaluate her today and she is in agreement nutrition she is trying to wear so we can address inserts inside of her shoe.  She has been trying to do the stretching icing but not on a consistent basis.  She denies any acute changes since last appointment she has no other concerns today.  We spent about 5 minutes in face-to-face time discussing options for the orthotics.  Trula Slade DPM

## 2017-09-28 ENCOUNTER — Telehealth: Payer: Self-pay | Admitting: Podiatry

## 2017-09-28 NOTE — Telephone Encounter (Signed)
Pt returned a call from me about her orthotics and left a message @ 113pm today stating the orthotics should not have even been ordered because she never dropped off her shoe for Rick to send with the order. She stated she wanted dress style orthotics and has not had a chance to drop off the shoe she wanted the orthotic for. She said there was a misunderstanding last week at her appt with Liliane Channel already and thinks this is was one also.Please call her at her work number.

## 2017-10-13 NOTE — Progress Notes (Signed)

## 2017-11-04 DIAGNOSIS — Z13220 Encounter for screening for lipoid disorders: Secondary | ICD-10-CM | POA: Diagnosis not present

## 2017-11-04 DIAGNOSIS — R7301 Impaired fasting glucose: Secondary | ICD-10-CM | POA: Diagnosis not present

## 2017-11-04 DIAGNOSIS — Z23 Encounter for immunization: Secondary | ICD-10-CM | POA: Diagnosis not present

## 2017-11-04 DIAGNOSIS — Z0000 Encounter for general adult medical examination without abnormal findings: Secondary | ICD-10-CM | POA: Diagnosis not present

## 2017-11-23 ENCOUNTER — Telehealth: Payer: Self-pay | Admitting: Podiatry

## 2017-11-23 NOTE — Telephone Encounter (Signed)
Pt left message yesterday that she has spoke to billing and they transferred her to my line. Pt was upset about the whole process of the orthotics and has not gotten them yet. She said the wrong ones were ordered originally and she is not wanting to pay the 25.00 balance until she has them and tried them out first.  I returned call and explained that they are in the office and it is ok for her to pick them up. She will need to sign a form stating she picked them up. But to call me if any issues and I will make sure Liliane Channel gets the information or schedule her to see him.. She said she will probably pick them up tomorrow. Billing did add note to the account.

## 2017-12-06 DIAGNOSIS — J029 Acute pharyngitis, unspecified: Secondary | ICD-10-CM | POA: Diagnosis not present

## 2017-12-06 DIAGNOSIS — J3489 Other specified disorders of nose and nasal sinuses: Secondary | ICD-10-CM | POA: Diagnosis not present

## 2017-12-21 DIAGNOSIS — R5381 Other malaise: Secondary | ICD-10-CM | POA: Diagnosis not present

## 2017-12-21 DIAGNOSIS — F419 Anxiety disorder, unspecified: Secondary | ICD-10-CM | POA: Diagnosis not present

## 2018-01-05 DIAGNOSIS — R002 Palpitations: Secondary | ICD-10-CM | POA: Diagnosis not present

## 2018-01-05 DIAGNOSIS — R0683 Snoring: Secondary | ICD-10-CM | POA: Diagnosis not present

## 2018-01-10 ENCOUNTER — Other Ambulatory Visit: Payer: Self-pay | Admitting: Internal Medicine

## 2018-01-10 ENCOUNTER — Ambulatory Visit (INDEPENDENT_AMBULATORY_CARE_PROVIDER_SITE_OTHER): Payer: BLUE CROSS/BLUE SHIELD

## 2018-01-10 DIAGNOSIS — R002 Palpitations: Secondary | ICD-10-CM

## 2018-01-12 ENCOUNTER — Telehealth: Payer: Self-pay

## 2018-01-12 NOTE — Telephone Encounter (Signed)
SENT REFERRAL TO SCHEDULING AND FILED NOTES 

## 2018-01-17 ENCOUNTER — Ambulatory Visit (HOSPITAL_COMMUNITY): Payer: BLUE CROSS/BLUE SHIELD | Attending: Cardiology

## 2018-01-17 ENCOUNTER — Other Ambulatory Visit: Payer: Self-pay

## 2018-01-17 ENCOUNTER — Other Ambulatory Visit: Payer: Self-pay | Admitting: Internal Medicine

## 2018-01-17 DIAGNOSIS — I491 Atrial premature depolarization: Secondary | ICD-10-CM | POA: Insufficient documentation

## 2018-01-18 ENCOUNTER — Other Ambulatory Visit: Payer: Self-pay | Admitting: Internal Medicine

## 2018-01-18 DIAGNOSIS — G4733 Obstructive sleep apnea (adult) (pediatric): Secondary | ICD-10-CM | POA: Diagnosis not present

## 2018-01-18 DIAGNOSIS — Z1231 Encounter for screening mammogram for malignant neoplasm of breast: Secondary | ICD-10-CM

## 2018-01-20 DIAGNOSIS — G4733 Obstructive sleep apnea (adult) (pediatric): Secondary | ICD-10-CM | POA: Diagnosis not present

## 2018-01-24 DIAGNOSIS — G4733 Obstructive sleep apnea (adult) (pediatric): Secondary | ICD-10-CM | POA: Diagnosis not present

## 2018-01-31 DIAGNOSIS — I491 Atrial premature depolarization: Secondary | ICD-10-CM | POA: Diagnosis not present

## 2018-01-31 DIAGNOSIS — G4733 Obstructive sleep apnea (adult) (pediatric): Secondary | ICD-10-CM | POA: Diagnosis not present

## 2018-02-07 DIAGNOSIS — G4733 Obstructive sleep apnea (adult) (pediatric): Secondary | ICD-10-CM | POA: Diagnosis not present

## 2018-02-27 ENCOUNTER — Ambulatory Visit: Payer: BLUE CROSS/BLUE SHIELD

## 2018-03-07 ENCOUNTER — Other Ambulatory Visit: Payer: Self-pay | Admitting: Internal Medicine

## 2018-03-07 ENCOUNTER — Ambulatory Visit
Admission: RE | Admit: 2018-03-07 | Discharge: 2018-03-07 | Disposition: A | Discharge status: Home / Self Care | Payer: BLUE CROSS/BLUE SHIELD | Source: Ambulatory Visit | Attending: Internal Medicine | Admitting: Internal Medicine

## 2018-03-07 DIAGNOSIS — Z1231 Encounter for screening mammogram for malignant neoplasm of breast: Secondary | ICD-10-CM | POA: Diagnosis not present

## 2018-03-07 DIAGNOSIS — R928 Other abnormal and inconclusive findings on diagnostic imaging of breast: Secondary | ICD-10-CM

## 2018-03-08 ENCOUNTER — Ambulatory Visit
Admission: RE | Admit: 2018-03-08 | Discharge: 2018-03-08 | Disposition: A | Discharge status: Home / Self Care | Payer: BLUE CROSS/BLUE SHIELD | Source: Ambulatory Visit | Attending: Internal Medicine | Admitting: Internal Medicine

## 2018-03-08 DIAGNOSIS — R928 Other abnormal and inconclusive findings on diagnostic imaging of breast: Secondary | ICD-10-CM

## 2018-03-08 DIAGNOSIS — N6489 Other specified disorders of breast: Secondary | ICD-10-CM | POA: Diagnosis not present

## 2018-03-08 DIAGNOSIS — R922 Inconclusive mammogram: Secondary | ICD-10-CM | POA: Diagnosis not present

## 2018-03-10 ENCOUNTER — Other Ambulatory Visit: Payer: BLUE CROSS/BLUE SHIELD

## 2018-03-13 DIAGNOSIS — L821 Other seborrheic keratosis: Secondary | ICD-10-CM | POA: Diagnosis not present

## 2018-03-13 DIAGNOSIS — D1801 Hemangioma of skin and subcutaneous tissue: Secondary | ICD-10-CM | POA: Diagnosis not present

## 2018-03-13 DIAGNOSIS — D225 Melanocytic nevi of trunk: Secondary | ICD-10-CM | POA: Diagnosis not present

## 2018-03-13 DIAGNOSIS — L304 Erythema intertrigo: Secondary | ICD-10-CM | POA: Diagnosis not present

## 2018-06-01 DIAGNOSIS — G4733 Obstructive sleep apnea (adult) (pediatric): Secondary | ICD-10-CM | POA: Diagnosis not present

## 2018-06-01 DIAGNOSIS — I491 Atrial premature depolarization: Secondary | ICD-10-CM | POA: Diagnosis not present

## 2018-06-01 DIAGNOSIS — R229 Localized swelling, mass and lump, unspecified: Secondary | ICD-10-CM | POA: Diagnosis not present

## 2018-06-15 ENCOUNTER — Other Ambulatory Visit: Payer: Self-pay | Admitting: Internal Medicine

## 2018-06-15 DIAGNOSIS — R1907 Generalized intra-abdominal and pelvic swelling, mass and lump: Secondary | ICD-10-CM

## 2018-06-15 DIAGNOSIS — R1903 Right lower quadrant abdominal swelling, mass and lump: Secondary | ICD-10-CM | POA: Diagnosis not present

## 2018-06-20 ENCOUNTER — Ambulatory Visit
Admission: RE | Admit: 2018-06-20 | Discharge: 2018-06-20 | Disposition: A | Discharge status: Home / Self Care | Payer: Medicare Other | Source: Ambulatory Visit | Attending: Internal Medicine | Admitting: Internal Medicine

## 2018-06-20 ENCOUNTER — Other Ambulatory Visit: Payer: Self-pay

## 2018-06-20 DIAGNOSIS — R109 Unspecified abdominal pain: Secondary | ICD-10-CM | POA: Diagnosis not present

## 2018-06-20 DIAGNOSIS — R1907 Generalized intra-abdominal and pelvic swelling, mass and lump: Secondary | ICD-10-CM

## 2018-06-20 MED ORDER — IOPAMIDOL (ISOVUE-300) INJECTION 61%
100.0000 mL | Freq: Once | INTRAVENOUS | Status: AC | PRN
  Administered 2018-06-20: 100 mL via INTRAVENOUS

## 2018-07-04 DIAGNOSIS — D124 Benign neoplasm of descending colon: Secondary | ICD-10-CM | POA: Diagnosis not present

## 2018-07-04 DIAGNOSIS — R933 Abnormal findings on diagnostic imaging of other parts of digestive tract: Secondary | ICD-10-CM | POA: Diagnosis not present

## 2018-07-07 DIAGNOSIS — D124 Benign neoplasm of descending colon: Secondary | ICD-10-CM | POA: Diagnosis not present

## 2018-08-17 DIAGNOSIS — Z6838 Body mass index (BMI) 38.0-38.9, adult: Secondary | ICD-10-CM | POA: Diagnosis not present

## 2018-08-17 DIAGNOSIS — K6389 Other specified diseases of intestine: Secondary | ICD-10-CM | POA: Diagnosis not present

## 2018-10-09 DIAGNOSIS — Z01818 Encounter for other preprocedural examination: Secondary | ICD-10-CM | POA: Diagnosis not present

## 2018-10-15 DIAGNOSIS — Z23 Encounter for immunization: Secondary | ICD-10-CM | POA: Diagnosis not present

## 2018-10-17 DIAGNOSIS — Z01818 Encounter for other preprocedural examination: Secondary | ICD-10-CM | POA: Diagnosis not present

## 2018-10-20 DIAGNOSIS — K635 Polyp of colon: Secondary | ICD-10-CM | POA: Diagnosis present

## 2018-10-20 DIAGNOSIS — K6389 Other specified diseases of intestine: Secondary | ICD-10-CM | POA: Diagnosis not present

## 2018-10-20 DIAGNOSIS — Z87891 Personal history of nicotine dependence: Secondary | ICD-10-CM | POA: Diagnosis not present

## 2018-10-20 DIAGNOSIS — G4730 Sleep apnea, unspecified: Secondary | ICD-10-CM | POA: Diagnosis present

## 2018-10-20 DIAGNOSIS — N805 Endometriosis of intestine: Secondary | ICD-10-CM | POA: Diagnosis not present

## 2018-10-20 DIAGNOSIS — N808 Other endometriosis: Secondary | ICD-10-CM | POA: Diagnosis not present

## 2018-10-20 DIAGNOSIS — M170 Bilateral primary osteoarthritis of knee: Secondary | ICD-10-CM | POA: Diagnosis present

## 2018-12-14 DIAGNOSIS — N3281 Overactive bladder: Secondary | ICD-10-CM | POA: Diagnosis not present

## 2018-12-14 DIAGNOSIS — I491 Atrial premature depolarization: Secondary | ICD-10-CM | POA: Diagnosis not present

## 2018-12-14 DIAGNOSIS — Z0000 Encounter for general adult medical examination without abnormal findings: Secondary | ICD-10-CM | POA: Diagnosis not present

## 2018-12-14 DIAGNOSIS — G4733 Obstructive sleep apnea (adult) (pediatric): Secondary | ICD-10-CM | POA: Diagnosis not present

## 2018-12-14 DIAGNOSIS — Z1159 Encounter for screening for other viral diseases: Secondary | ICD-10-CM | POA: Diagnosis not present

## 2018-12-14 DIAGNOSIS — Z13220 Encounter for screening for lipoid disorders: Secondary | ICD-10-CM | POA: Diagnosis not present

## 2018-12-14 DIAGNOSIS — R7301 Impaired fasting glucose: Secondary | ICD-10-CM | POA: Diagnosis not present

## 2018-12-14 DIAGNOSIS — Z1389 Encounter for screening for other disorder: Secondary | ICD-10-CM | POA: Diagnosis not present

## 2019-02-09 DIAGNOSIS — Z23 Encounter for immunization: Secondary | ICD-10-CM | POA: Diagnosis not present

## 2019-03-07 DIAGNOSIS — Z23 Encounter for immunization: Secondary | ICD-10-CM | POA: Diagnosis not present

## 2019-04-10 ENCOUNTER — Telehealth: Payer: Self-pay | Admitting: Podiatry

## 2019-04-10 NOTE — Telephone Encounter (Signed)
Pt left message asking about getting another pair of orthotics made just like the ones from a couple of years ago.  I returned call and left message that the cost is 438.00 and if she is wanting [place] to bill insurance she would need an updated office visit since she has not been seen since 2019. I told pt to call to discuss further.

## 2019-04-11 DIAGNOSIS — Z961 Presence of intraocular lens: Secondary | ICD-10-CM | POA: Diagnosis not present

## 2019-04-11 DIAGNOSIS — H43393 Other vitreous opacities, bilateral: Secondary | ICD-10-CM | POA: Diagnosis not present

## 2019-04-11 DIAGNOSIS — H5213 Myopia, bilateral: Secondary | ICD-10-CM | POA: Diagnosis not present

## 2019-04-11 DIAGNOSIS — H524 Presbyopia: Secondary | ICD-10-CM | POA: Diagnosis not present

## 2019-04-25 DIAGNOSIS — H9319 Tinnitus, unspecified ear: Secondary | ICD-10-CM | POA: Diagnosis not present

## 2019-04-25 DIAGNOSIS — H9190 Unspecified hearing loss, unspecified ear: Secondary | ICD-10-CM | POA: Diagnosis not present

## 2019-04-25 DIAGNOSIS — M25561 Pain in right knee: Secondary | ICD-10-CM | POA: Diagnosis not present

## 2019-04-25 DIAGNOSIS — R002 Palpitations: Secondary | ICD-10-CM | POA: Diagnosis not present
